# Patient Record
Sex: Female | Born: 1935 | Race: White | Hispanic: No | Marital: Married
Health system: Southern US, Community
[De-identification: ages and names within clinical notes are randomized; demographics above are authoritative.]

---

## 2005-10-20 ENCOUNTER — Ambulatory Visit: Payer: Self-pay | Admitting: Internal Medicine

## 2005-10-20 ENCOUNTER — Ambulatory Visit (HOSPITAL_COMMUNITY): Admission: RE | Admit: 2005-10-20 | Discharge: 2005-10-20 | Payer: Self-pay | Admitting: Internal Medicine

## 2011-02-08 ENCOUNTER — Ambulatory Visit (INDEPENDENT_AMBULATORY_CARE_PROVIDER_SITE_OTHER): Payer: Medicare Other | Admitting: Internal Medicine

## 2011-02-08 DIAGNOSIS — R198 Other specified symptoms and signs involving the digestive system and abdomen: Secondary | ICD-10-CM

## 2011-02-08 DIAGNOSIS — K589 Irritable bowel syndrome without diarrhea: Secondary | ICD-10-CM

## 2011-02-08 DIAGNOSIS — R197 Diarrhea, unspecified: Secondary | ICD-10-CM

## 2011-02-26 NOTE — Consult Note (Signed)
NAME:  Carol Moore, Carol Moore               ACCOUNT NO.:  [id number]  MEDICAL RECORD NO.:                     PATIENT TYPE:  LOCATION:                                 FACILITY:  PHYSICIAN:  Lionel December, M.D.    DATE OF BIRTH:  Mar 07, 1930  DATE OF CONSULTATION: DATE OF DISCHARGE:                                CONSULTATION   Please note that this note is for [facility] GI diseases.  PRESENTING COMPLAINT:  Irregular bowel movements since December 2011.  HISTORY OF PRESENT ILLNESS:  Carol Moore is a 77 year old Caucasian female who is being evaluated at request by her son, Dr. Carylon Perches, for irregular bowel movements.  Carol Moore states that her present problem started soon after Christmas holidays while she was visiting her family, [place], [place].  She had abdominal cramps and formed stool by loose stools and diarrhea lasted for about 24 hours.  She does not recall that she had fever or rectal bleeding.  She did experience some nausea, relieved with her bowel movements.  Since then she has changed her diet.  However, her bowels have not been regular.  She has kept stool diary for the last few weeks and she either has hard large bowel movements, loose stools, or she may skip 2-3 days at a time without a bowel movement.  At times, she does not feel right in her abdomen what she describes as unsettled abdomen.  She has not had low-grade cramps. She has tried [place] for 2 weeks, but it did not make any difference. She says her appetite is not been as good.  She did lose couple of pounds, but she has gained it back.  She denies passing excessive flatus.  She is also had sporadic episodes of fecal seepage and she has had one recently.  She states when she has an urge, she better run to the bathroom.  She is also very concerned because of unpredictability and she states that she is afraid to go watch a play or movie.  Back in February, she took metronidazole for 1 week without  any relief of her symptoms.  She was given WelChol by her primary care physician in [place] and [place], but it did not help either.  She has not experienced any heartburn, dysuria, hematuria, or vaginal symptoms.  She is up-to- date on her pelvic exam, exam which have been normal.  The patient's last screening colonoscopy was about 5 years ago here at [facility], and it was possibly normal.  I do not have the actual procedure note with me.  CURRENT MEDICATIONS: 1. HyoMax sublingual one at bedtime. 2. Latanoprost eyedrops 1 drop to each eye once a day. 3. Restasis 1 drop to each eye twice daily.  PAST MEDICAL HISTORY:  She has glaucoma involving both eyelids.  She had tonsillectomy at age 68.  She had screening colonoscopy about 5 years ago is unremarkable.  ALLERGIES:  NK.  FAMILY HISTORY:  Mother lived to be in her 70s.  Father died at 64 following prostate surgery.  She has two sisters living;  one sister died in her 18s, one sister is 22 in a retirement home, another sister is 20 years old who has been treated for breast CA and is doing very well. She lives at home.  She does have macular degeneration.  SOCIAL HISTORY:  She lost her husband 1 year ago.  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 She lives in [place], [place].  She is visiting her family in [place].  She has one son, Dr. Carylon Perches.  She has never smoked cigarettes or drank alcohol.  OBJECTIVE:  VITAL SIGNS:  Weight 125.5 pounds, she is 65 inches tall, pulse 68 per minute and regular, blood pressure 170/90. HEENT:  Conjunctivae is pink.  Sclerae nonicteric.  Oropharyngeal mucosa is normal.  No neck masses or thyromegaly noted.  CARDIAC:  Regular rhythm.  Normal S1 and S3.  No murmur or gallop noted. LUNGS:  Clear to auscultation. ABDOMEN:  Flat.  Bowel sounds are normal.  On palpation soft abdomen without hepatosplenomegaly or masses. EXTREMITIES:  No  peripheral edema or clubbing noted.  ASSESSMENT:  Carol Moore symptoms are typical of irritable bowel syndrome. She does not have any alarm symptoms.  However, she has not responded to therapy to date.  If she does not respond to simple measures, we would consider 2-week course of Xifaxan before pursuing with sigmoidoscopy, etc.  RECOMMENDATIONS: 1. Hemoccult x1. 2. She will have CBC with diff, TSH, and serum calcium. 3. She will continue her current diet.  She will start Benefiber 3-4 g     daily. 4. Colace 2 tablets at bedtime.  She will keep a stool diary.  If she does not have a BM in 2 days, but feels an urge, if cannot have a BM, she will use glycerine or bisacodyl suppository.  Progress will be reviewed over the phone with her in 4 weeks.  We appreciate the opportunity to participate   Dictation ended at this point.     Lionel December, M.D.     NR/MEDQ  D:  02/05/2011  T:  02/06/2011  Job:  [id number]  Electronically Signed by Lionel December M.D. on 02/26/2011 11:29:33 AM

## 2014-01-04 DIAGNOSIS — H26499 Other secondary cataract, unspecified eye: Secondary | ICD-10-CM | POA: Diagnosis not present

## 2014-01-04 DIAGNOSIS — H4011X0 Primary open-angle glaucoma, stage unspecified: Secondary | ICD-10-CM | POA: Diagnosis not present

## 2014-01-04 DIAGNOSIS — H00029 Hordeolum internum unspecified eye, unspecified eyelid: Secondary | ICD-10-CM | POA: Diagnosis not present

## 2014-01-04 DIAGNOSIS — H04129 Dry eye syndrome of unspecified lacrimal gland: Secondary | ICD-10-CM | POA: Diagnosis not present

## 2014-05-07 DIAGNOSIS — I10 Essential (primary) hypertension: Secondary | ICD-10-CM | POA: Diagnosis not present

## 2014-05-07 DIAGNOSIS — Z79899 Other long term (current) drug therapy: Secondary | ICD-10-CM | POA: Diagnosis not present

## 2014-06-04 DIAGNOSIS — H4011X0 Primary open-angle glaucoma, stage unspecified: Secondary | ICD-10-CM | POA: Diagnosis not present

## 2014-07-22 DIAGNOSIS — H26499 Other secondary cataract, unspecified eye: Secondary | ICD-10-CM | POA: Diagnosis not present

## 2014-07-22 DIAGNOSIS — H00029 Hordeolum internum unspecified eye, unspecified eyelid: Secondary | ICD-10-CM | POA: Diagnosis not present

## 2014-07-22 DIAGNOSIS — H04129 Dry eye syndrome of unspecified lacrimal gland: Secondary | ICD-10-CM | POA: Diagnosis not present

## 2014-07-22 DIAGNOSIS — H4011X0 Primary open-angle glaucoma, stage unspecified: Secondary | ICD-10-CM | POA: Diagnosis not present

## 2014-09-13 DIAGNOSIS — Z1231 Encounter for screening mammogram for malignant neoplasm of breast: Secondary | ICD-10-CM | POA: Diagnosis not present

## 2014-09-20 DIAGNOSIS — Z23 Encounter for immunization: Secondary | ICD-10-CM | POA: Diagnosis not present

## 2015-02-06 DIAGNOSIS — H04123 Dry eye syndrome of bilateral lacrimal glands: Secondary | ICD-10-CM | POA: Diagnosis not present

## 2015-02-06 DIAGNOSIS — H26493 Other secondary cataract, bilateral: Secondary | ICD-10-CM | POA: Diagnosis not present

## 2015-02-06 DIAGNOSIS — H4011X2 Primary open-angle glaucoma, moderate stage: Secondary | ICD-10-CM | POA: Diagnosis not present

## 2015-02-06 DIAGNOSIS — H01001 Unspecified blepharitis right upper eyelid: Secondary | ICD-10-CM | POA: Diagnosis not present

## 2015-02-13 DIAGNOSIS — H4011X2 Primary open-angle glaucoma, moderate stage: Secondary | ICD-10-CM | POA: Diagnosis not present

## 2015-04-08 ENCOUNTER — Telehealth: Payer: Self-pay | Admitting: *Deleted

## 2015-04-08 NOTE — Telephone Encounter (Addendum)
Entered in error

## 2015-08-11 DIAGNOSIS — H01001 Unspecified blepharitis right upper eyelid: Secondary | ICD-10-CM | POA: Diagnosis not present

## 2015-08-11 DIAGNOSIS — H401132 Primary open-angle glaucoma, bilateral, moderate stage: Secondary | ICD-10-CM | POA: Diagnosis not present

## 2015-08-11 DIAGNOSIS — H04123 Dry eye syndrome of bilateral lacrimal glands: Secondary | ICD-10-CM | POA: Diagnosis not present

## 2015-08-11 DIAGNOSIS — H26493 Other secondary cataract, bilateral: Secondary | ICD-10-CM | POA: Diagnosis not present

## 2015-09-08 DIAGNOSIS — H401132 Primary open-angle glaucoma, bilateral, moderate stage: Secondary | ICD-10-CM | POA: Diagnosis not present

## 2015-09-15 DIAGNOSIS — Z1231 Encounter for screening mammogram for malignant neoplasm of breast: Secondary | ICD-10-CM | POA: Diagnosis not present

## 2015-09-19 DIAGNOSIS — Z79899 Other long term (current) drug therapy: Secondary | ICD-10-CM | POA: Diagnosis not present

## 2015-09-19 DIAGNOSIS — Z23 Encounter for immunization: Secondary | ICD-10-CM | POA: Diagnosis not present

## 2015-09-19 DIAGNOSIS — E785 Hyperlipidemia, unspecified: Secondary | ICD-10-CM | POA: Diagnosis not present

## 2015-09-19 DIAGNOSIS — I10 Essential (primary) hypertension: Secondary | ICD-10-CM | POA: Diagnosis not present

## 2016-02-06 DIAGNOSIS — H401132 Primary open-angle glaucoma, bilateral, moderate stage: Secondary | ICD-10-CM | POA: Diagnosis not present

## 2016-02-06 DIAGNOSIS — H04123 Dry eye syndrome of bilateral lacrimal glands: Secondary | ICD-10-CM | POA: Diagnosis not present

## 2016-02-06 DIAGNOSIS — H26493 Other secondary cataract, bilateral: Secondary | ICD-10-CM | POA: Diagnosis not present

## 2016-02-06 DIAGNOSIS — H43813 Vitreous degeneration, bilateral: Secondary | ICD-10-CM | POA: Diagnosis not present

## 2016-05-18 DIAGNOSIS — M25531 Pain in right wrist: Secondary | ICD-10-CM | POA: Diagnosis not present

## 2016-06-28 DIAGNOSIS — L821 Other seborrheic keratosis: Secondary | ICD-10-CM | POA: Diagnosis not present

## 2016-06-28 DIAGNOSIS — L570 Actinic keratosis: Secondary | ICD-10-CM | POA: Diagnosis not present

## 2016-06-28 DIAGNOSIS — X32XXXD Exposure to sunlight, subsequent encounter: Secondary | ICD-10-CM | POA: Diagnosis not present

## 2016-06-28 DIAGNOSIS — Z1283 Encounter for screening for malignant neoplasm of skin: Secondary | ICD-10-CM | POA: Diagnosis not present

## 2016-06-28 DIAGNOSIS — D225 Melanocytic nevi of trunk: Secondary | ICD-10-CM | POA: Diagnosis not present

## 2016-07-08 DIAGNOSIS — H401132 Primary open-angle glaucoma, bilateral, moderate stage: Secondary | ICD-10-CM | POA: Diagnosis not present

## 2016-08-16 DIAGNOSIS — H401132 Primary open-angle glaucoma, bilateral, moderate stage: Secondary | ICD-10-CM | POA: Diagnosis not present

## 2016-08-16 DIAGNOSIS — H26493 Other secondary cataract, bilateral: Secondary | ICD-10-CM | POA: Diagnosis not present

## 2016-08-16 DIAGNOSIS — H04123 Dry eye syndrome of bilateral lacrimal glands: Secondary | ICD-10-CM | POA: Diagnosis not present

## 2016-09-15 DIAGNOSIS — Z23 Encounter for immunization: Secondary | ICD-10-CM | POA: Diagnosis not present

## 2016-10-07 DIAGNOSIS — I10 Essential (primary) hypertension: Secondary | ICD-10-CM | POA: Diagnosis not present

## 2016-10-07 DIAGNOSIS — S79912A Unspecified injury of left hip, initial encounter: Secondary | ICD-10-CM | POA: Diagnosis not present

## 2016-10-07 DIAGNOSIS — S99912A Unspecified injury of left ankle, initial encounter: Secondary | ICD-10-CM | POA: Diagnosis not present

## 2016-10-07 DIAGNOSIS — S79911A Unspecified injury of right hip, initial encounter: Secondary | ICD-10-CM | POA: Diagnosis not present

## 2016-10-07 DIAGNOSIS — S32501A Unspecified fracture of right pubis, initial encounter for closed fracture: Secondary | ICD-10-CM | POA: Diagnosis not present

## 2016-10-07 DIAGNOSIS — W1839XA Other fall on same level, initial encounter: Secondary | ICD-10-CM | POA: Diagnosis not present

## 2016-10-07 DIAGNOSIS — Z9089 Acquired absence of other organs: Secondary | ICD-10-CM | POA: Diagnosis not present

## 2016-10-07 DIAGNOSIS — M25551 Pain in right hip: Secondary | ICD-10-CM | POA: Diagnosis not present

## 2016-10-07 DIAGNOSIS — Y9389 Activity, other specified: Secondary | ICD-10-CM | POA: Diagnosis not present

## 2016-10-28 ENCOUNTER — Other Ambulatory Visit (HOSPITAL_COMMUNITY): Payer: Self-pay | Admitting: Internal Medicine

## 2016-10-28 ENCOUNTER — Ambulatory Visit (HOSPITAL_COMMUNITY)
Admission: RE | Admit: 2016-10-28 | Discharge: 2016-10-28 | Disposition: A | Discharge status: Home / Self Care | Payer: Medicare Other | Source: Ambulatory Visit | Attending: Internal Medicine | Admitting: Internal Medicine

## 2016-10-28 DIAGNOSIS — M25552 Pain in left hip: Secondary | ICD-10-CM | POA: Diagnosis not present

## 2016-10-28 DIAGNOSIS — S32502A Unspecified fracture of left pubis, initial encounter for closed fracture: Secondary | ICD-10-CM | POA: Diagnosis not present

## 2016-10-28 DIAGNOSIS — S79912A Unspecified injury of left hip, initial encounter: Secondary | ICD-10-CM | POA: Diagnosis not present

## 2016-12-17 DIAGNOSIS — Z1231 Encounter for screening mammogram for malignant neoplasm of breast: Secondary | ICD-10-CM | POA: Diagnosis not present

## 2017-01-13 DIAGNOSIS — H401132 Primary open-angle glaucoma, bilateral, moderate stage: Secondary | ICD-10-CM | POA: Diagnosis not present

## 2017-02-01 ENCOUNTER — Other Ambulatory Visit (HOSPITAL_COMMUNITY): Payer: Self-pay | Admitting: Internal Medicine

## 2017-02-01 DIAGNOSIS — M80059A Age-related osteoporosis with current pathological fracture, unspecified femur, initial encounter for fracture: Secondary | ICD-10-CM

## 2017-02-03 ENCOUNTER — Ambulatory Visit (HOSPITAL_COMMUNITY)
Admission: RE | Admit: 2017-02-03 | Discharge: 2017-02-03 | Disposition: A | Discharge status: Home / Self Care | Payer: Medicare Other | Source: Ambulatory Visit | Attending: Internal Medicine | Admitting: Internal Medicine

## 2017-02-03 ENCOUNTER — Other Ambulatory Visit (HOSPITAL_COMMUNITY): Payer: Medicare Other

## 2017-02-03 DIAGNOSIS — Z780 Asymptomatic menopausal state: Secondary | ICD-10-CM | POA: Insufficient documentation

## 2017-02-03 DIAGNOSIS — M810 Age-related osteoporosis without current pathological fracture: Secondary | ICD-10-CM | POA: Insufficient documentation

## 2017-02-03 DIAGNOSIS — M80059A Age-related osteoporosis with current pathological fracture, unspecified femur, initial encounter for fracture: Secondary | ICD-10-CM

## 2017-03-17 DIAGNOSIS — H04123 Dry eye syndrome of bilateral lacrimal glands: Secondary | ICD-10-CM | POA: Diagnosis not present

## 2017-03-17 DIAGNOSIS — H26493 Other secondary cataract, bilateral: Secondary | ICD-10-CM | POA: Diagnosis not present

## 2017-03-17 DIAGNOSIS — H401132 Primary open-angle glaucoma, bilateral, moderate stage: Secondary | ICD-10-CM | POA: Diagnosis not present

## 2017-03-17 DIAGNOSIS — H01001 Unspecified blepharitis right upper eyelid: Secondary | ICD-10-CM | POA: Diagnosis not present

## 2017-08-04 DIAGNOSIS — H401132 Primary open-angle glaucoma, bilateral, moderate stage: Secondary | ICD-10-CM | POA: Diagnosis not present

## 2017-08-19 DIAGNOSIS — Z23 Encounter for immunization: Secondary | ICD-10-CM | POA: Diagnosis not present

## 2017-09-27 DIAGNOSIS — H04123 Dry eye syndrome of bilateral lacrimal glands: Secondary | ICD-10-CM | POA: Diagnosis not present

## 2017-09-27 DIAGNOSIS — H26493 Other secondary cataract, bilateral: Secondary | ICD-10-CM | POA: Diagnosis not present

## 2017-09-27 DIAGNOSIS — H43813 Vitreous degeneration, bilateral: Secondary | ICD-10-CM | POA: Diagnosis not present

## 2017-09-27 DIAGNOSIS — H401133 Primary open-angle glaucoma, bilateral, severe stage: Secondary | ICD-10-CM | POA: Diagnosis not present

## 2017-10-24 DIAGNOSIS — Z23 Encounter for immunization: Secondary | ICD-10-CM | POA: Diagnosis not present

## 2017-12-19 DIAGNOSIS — Z1231 Encounter for screening mammogram for malignant neoplasm of breast: Secondary | ICD-10-CM | POA: Diagnosis not present

## 2018-02-27 DIAGNOSIS — H401133 Primary open-angle glaucoma, bilateral, severe stage: Secondary | ICD-10-CM | POA: Diagnosis not present

## 2018-03-23 DIAGNOSIS — H401133 Primary open-angle glaucoma, bilateral, severe stage: Secondary | ICD-10-CM | POA: Diagnosis not present

## 2018-03-23 DIAGNOSIS — H04123 Dry eye syndrome of bilateral lacrimal glands: Secondary | ICD-10-CM | POA: Diagnosis not present

## 2018-05-05 DIAGNOSIS — I10 Essential (primary) hypertension: Secondary | ICD-10-CM | POA: Diagnosis not present

## 2018-05-05 DIAGNOSIS — Z79899 Other long term (current) drug therapy: Secondary | ICD-10-CM | POA: Diagnosis not present

## 2018-05-05 DIAGNOSIS — E785 Hyperlipidemia, unspecified: Secondary | ICD-10-CM | POA: Diagnosis not present

## 2018-09-05 DIAGNOSIS — H43813 Vitreous degeneration, bilateral: Secondary | ICD-10-CM | POA: Diagnosis not present

## 2018-09-05 DIAGNOSIS — H26493 Other secondary cataract, bilateral: Secondary | ICD-10-CM | POA: Diagnosis not present

## 2018-09-05 DIAGNOSIS — H401133 Primary open-angle glaucoma, bilateral, severe stage: Secondary | ICD-10-CM | POA: Diagnosis not present

## 2018-09-05 DIAGNOSIS — D3132 Benign neoplasm of left choroid: Secondary | ICD-10-CM | POA: Diagnosis not present

## 2018-09-18 ENCOUNTER — Other Ambulatory Visit (HOSPITAL_COMMUNITY): Payer: Self-pay | Admitting: Internal Medicine

## 2018-09-18 DIAGNOSIS — R05 Cough: Secondary | ICD-10-CM

## 2018-09-18 DIAGNOSIS — R059 Cough, unspecified: Secondary | ICD-10-CM

## 2018-09-20 ENCOUNTER — Ambulatory Visit (HOSPITAL_COMMUNITY)
Admission: RE | Admit: 2018-09-20 | Discharge: 2018-09-20 | Disposition: A | Discharge status: Home / Self Care | Payer: Medicare Other | Source: Ambulatory Visit | Attending: Internal Medicine | Admitting: Internal Medicine

## 2018-09-20 DIAGNOSIS — J984 Other disorders of lung: Secondary | ICD-10-CM | POA: Diagnosis not present

## 2018-09-20 DIAGNOSIS — R059 Cough, unspecified: Secondary | ICD-10-CM

## 2018-09-20 DIAGNOSIS — J90 Pleural effusion, not elsewhere classified: Secondary | ICD-10-CM | POA: Insufficient documentation

## 2018-09-20 DIAGNOSIS — R918 Other nonspecific abnormal finding of lung field: Secondary | ICD-10-CM | POA: Insufficient documentation

## 2018-09-20 DIAGNOSIS — R05 Cough: Secondary | ICD-10-CM | POA: Insufficient documentation

## 2019-07-02 ENCOUNTER — Other Ambulatory Visit: Payer: Self-pay

## 2019-07-02 ENCOUNTER — Ambulatory Visit (HOSPITAL_COMMUNITY)
Admission: RE | Admit: 2019-07-02 | Discharge: 2019-07-02 | Disposition: A | Discharge status: Home / Self Care | Payer: Medicare Other | Source: Ambulatory Visit | Attending: Internal Medicine | Admitting: Internal Medicine

## 2019-07-02 ENCOUNTER — Other Ambulatory Visit (HOSPITAL_COMMUNITY): Payer: Self-pay | Admitting: Internal Medicine

## 2019-07-02 DIAGNOSIS — R05 Cough: Secondary | ICD-10-CM

## 2019-07-02 DIAGNOSIS — R059 Cough, unspecified: Secondary | ICD-10-CM

## 2019-10-31 ENCOUNTER — Ambulatory Visit (INDEPENDENT_AMBULATORY_CARE_PROVIDER_SITE_OTHER): Payer: Medicare Other | Admitting: Podiatry

## 2019-10-31 ENCOUNTER — Other Ambulatory Visit: Payer: Self-pay

## 2019-10-31 DIAGNOSIS — L600 Ingrowing nail: Secondary | ICD-10-CM | POA: Diagnosis not present

## 2019-10-31 MED ORDER — GENTAMICIN SULFATE 0.1 % EX CREA: 1.0000 "application " | TOPICAL_CREAM | Freq: Two times a day (BID) | CUTANEOUS | 1 refills | Status: AC

## 2019-10-31 MED ORDER — DOXYCYCLINE HYCLATE 100 MG PO TABS: 100.0000 mg | ORAL_TABLET | Freq: Two times a day (BID) | ORAL | 0 refills | Status: AC

## 2019-11-07 NOTE — Progress Notes (Signed)
   HPI: 86 y.o. female presenting today as a new patient with a chief complaint of pain to the left great toenail that began about two months ago. She reports associated redness and swelling of the area. She denies any modifying factors and has not had any treatment. Patient is here for further evaluation and treatment.   No past medical history on file.   Physical Exam: General: The patient is alert and oriented x3 in no acute distress.  Dermatology: Erythema and edema noted around the nail base of the left great toe. Skin is warm, dry and supple bilateral lower extremities. Negative for open lesions or macerations.  Vascular: Palpable pedal pulses bilaterally. Capillary refill within normal limits.  Neurological: Epicritic and protective threshold grossly intact bilaterally.   Musculoskeletal Exam: Range of motion within normal limits to all pedal and ankle joints bilateral. Muscle strength 5/5 in all groups bilateral.   Assessment: 1. Toenail infection / cellulitis left great toe    Plan of Care:  1. Patient evaluated.  2. Discussed treatment alternatives and plan of care. Explained nail avulsion procedure and post procedure course to patient. 3. Patient opted for total temporary nail avulsion of the left great toenail.  4. Prior to procedure, local anesthesia infiltration utilized using 3 ml of a 50:50 mixture of 2% plain lidocaine and 0.5% plain marcaine in a normal hallux block fashion and a betadine prep performed.  5. Light dressing applied. 6. Prescription for Doxycycline 100 mg #14 provided to patient.  7. Prescription for Gentamicin cream provided to patient to use daily with a bandage.  8. Return to clinic as needed.  Son is Dr. Asencion Noble, family practice in [place].      Edrick Kins, DPM [facility]  Dr. Edrick Kins, DPM    [address], [place] [address]                Office [phone]   Fax [phone]

## 2020-01-24 DIAGNOSIS — H401133 Primary open-angle glaucoma, bilateral, severe stage: Secondary | ICD-10-CM | POA: Diagnosis not present

## 2020-02-19 DIAGNOSIS — H401133 Primary open-angle glaucoma, bilateral, severe stage: Secondary | ICD-10-CM | POA: Diagnosis not present

## 2020-02-19 DIAGNOSIS — H43813 Vitreous degeneration, bilateral: Secondary | ICD-10-CM | POA: Diagnosis not present

## 2020-02-19 DIAGNOSIS — H26493 Other secondary cataract, bilateral: Secondary | ICD-10-CM | POA: Diagnosis not present

## 2020-02-19 DIAGNOSIS — H04123 Dry eye syndrome of bilateral lacrimal glands: Secondary | ICD-10-CM | POA: Diagnosis not present

## 2020-03-06 DIAGNOSIS — Z1231 Encounter for screening mammogram for malignant neoplasm of breast: Secondary | ICD-10-CM | POA: Diagnosis not present

## 2020-08-06 DIAGNOSIS — H26493 Other secondary cataract, bilateral: Secondary | ICD-10-CM | POA: Diagnosis not present

## 2020-09-04 DIAGNOSIS — H26493 Other secondary cataract, bilateral: Secondary | ICD-10-CM | POA: Diagnosis not present

## 2020-09-04 DIAGNOSIS — H401133 Primary open-angle glaucoma, bilateral, severe stage: Secondary | ICD-10-CM | POA: Diagnosis not present

## 2020-09-04 DIAGNOSIS — D3132 Benign neoplasm of left choroid: Secondary | ICD-10-CM | POA: Diagnosis not present

## 2020-09-04 DIAGNOSIS — H43813 Vitreous degeneration, bilateral: Secondary | ICD-10-CM | POA: Diagnosis not present

## 2021-03-10 DIAGNOSIS — D3132 Benign neoplasm of left choroid: Secondary | ICD-10-CM | POA: Diagnosis not present

## 2021-03-10 DIAGNOSIS — H401133 Primary open-angle glaucoma, bilateral, severe stage: Secondary | ICD-10-CM | POA: Diagnosis not present

## 2021-03-10 DIAGNOSIS — H26493 Other secondary cataract, bilateral: Secondary | ICD-10-CM | POA: Diagnosis not present

## 2021-03-10 DIAGNOSIS — H353131 Nonexudative age-related macular degeneration, bilateral, early dry stage: Secondary | ICD-10-CM | POA: Diagnosis not present

## 2021-03-10 DIAGNOSIS — H04123 Dry eye syndrome of bilateral lacrimal glands: Secondary | ICD-10-CM | POA: Diagnosis not present

## 2021-03-11 DIAGNOSIS — Z1231 Encounter for screening mammogram for malignant neoplasm of breast: Secondary | ICD-10-CM | POA: Diagnosis not present

## 2021-05-06 DIAGNOSIS — I10 Essential (primary) hypertension: Secondary | ICD-10-CM | POA: Diagnosis not present

## 2021-05-06 DIAGNOSIS — Z79899 Other long term (current) drug therapy: Secondary | ICD-10-CM | POA: Diagnosis not present

## 2021-05-06 DIAGNOSIS — N3001 Acute cystitis with hematuria: Secondary | ICD-10-CM | POA: Diagnosis not present

## 2021-05-06 DIAGNOSIS — N390 Urinary tract infection, site not specified: Secondary | ICD-10-CM | POA: Diagnosis not present

## 2021-08-24 DIAGNOSIS — H353131 Nonexudative age-related macular degeneration, bilateral, early dry stage: Secondary | ICD-10-CM | POA: Diagnosis not present

## 2021-09-02 DIAGNOSIS — H353131 Nonexudative age-related macular degeneration, bilateral, early dry stage: Secondary | ICD-10-CM | POA: Diagnosis not present

## 2021-09-23 ENCOUNTER — Other Ambulatory Visit: Payer: Self-pay

## 2021-09-23 ENCOUNTER — Other Ambulatory Visit (HOSPITAL_COMMUNITY): Payer: Self-pay | Admitting: Internal Medicine

## 2021-09-23 ENCOUNTER — Ambulatory Visit (HOSPITAL_COMMUNITY)
Admission: RE | Admit: 2021-09-23 | Discharge: 2021-09-23 | Disposition: A | Discharge status: Home / Self Care | Payer: Medicare PPO | Source: Ambulatory Visit | Attending: Internal Medicine | Admitting: Internal Medicine

## 2021-09-23 ENCOUNTER — Other Ambulatory Visit: Payer: Self-pay | Admitting: Internal Medicine

## 2021-09-23 DIAGNOSIS — G311 Senile degeneration of brain, not elsewhere classified: Secondary | ICD-10-CM | POA: Diagnosis not present

## 2021-09-23 DIAGNOSIS — M810 Age-related osteoporosis without current pathological fracture: Secondary | ICD-10-CM | POA: Diagnosis not present

## 2021-09-23 DIAGNOSIS — R410 Disorientation, unspecified: Secondary | ICD-10-CM

## 2021-09-23 DIAGNOSIS — S62633A Displaced fracture of distal phalanx of left middle finger, initial encounter for closed fracture: Secondary | ICD-10-CM | POA: Insufficient documentation

## 2021-09-23 DIAGNOSIS — M19042 Primary osteoarthritis, left hand: Secondary | ICD-10-CM | POA: Insufficient documentation

## 2021-09-23 DIAGNOSIS — G319 Degenerative disease of nervous system, unspecified: Secondary | ICD-10-CM | POA: Diagnosis not present

## 2021-09-23 DIAGNOSIS — R110 Nausea: Secondary | ICD-10-CM

## 2021-09-23 DIAGNOSIS — M79642 Pain in left hand: Secondary | ICD-10-CM | POA: Diagnosis not present

## 2021-09-23 DIAGNOSIS — S02842A Fracture of lateral orbital wall, left side, initial encounter for closed fracture: Secondary | ICD-10-CM | POA: Insufficient documentation

## 2021-09-23 DIAGNOSIS — W19XXXA Unspecified fall, initial encounter: Secondary | ICD-10-CM

## 2021-09-23 DIAGNOSIS — R9082 White matter disease, unspecified: Secondary | ICD-10-CM | POA: Diagnosis not present

## 2021-09-23 DIAGNOSIS — S0083XA Contusion of other part of head, initial encounter: Secondary | ICD-10-CM | POA: Insufficient documentation

## 2021-09-23 DIAGNOSIS — G9389 Other specified disorders of brain: Secondary | ICD-10-CM | POA: Diagnosis not present

## 2021-09-23 DIAGNOSIS — S0282XA Fracture of other specified skull and facial bones, left side, initial encounter for closed fracture: Secondary | ICD-10-CM | POA: Diagnosis not present

## 2021-10-01 DIAGNOSIS — Z23 Encounter for immunization: Secondary | ICD-10-CM | POA: Diagnosis not present

## 2021-10-01 DIAGNOSIS — S01119A Laceration without foreign body of unspecified eyelid and periocular area, initial encounter: Secondary | ICD-10-CM | POA: Diagnosis not present

## 2021-10-03 DIAGNOSIS — S81851A Open bite, right lower leg, initial encounter: Secondary | ICD-10-CM | POA: Diagnosis not present

## 2021-10-03 DIAGNOSIS — Y9289 Other specified places as the place of occurrence of the external cause: Secondary | ICD-10-CM | POA: Diagnosis not present

## 2021-10-03 DIAGNOSIS — Y9389 Activity, other specified: Secondary | ICD-10-CM | POA: Diagnosis not present

## 2021-10-03 DIAGNOSIS — I10 Essential (primary) hypertension: Secondary | ICD-10-CM | POA: Diagnosis not present

## 2021-10-03 DIAGNOSIS — W540XXA Bitten by dog, initial encounter: Secondary | ICD-10-CM | POA: Diagnosis not present

## 2021-10-03 DIAGNOSIS — S81852A Open bite, left lower leg, initial encounter: Secondary | ICD-10-CM | POA: Diagnosis not present

## 2021-10-12 DIAGNOSIS — S81812D Laceration without foreign body, left lower leg, subsequent encounter: Secondary | ICD-10-CM | POA: Diagnosis not present

## 2022-03-12 DIAGNOSIS — H353131 Nonexudative age-related macular degeneration, bilateral, early dry stage: Secondary | ICD-10-CM | POA: Diagnosis not present

## 2022-03-12 DIAGNOSIS — D3132 Benign neoplasm of left choroid: Secondary | ICD-10-CM | POA: Diagnosis not present

## 2022-03-12 DIAGNOSIS — H401133 Primary open-angle glaucoma, bilateral, severe stage: Secondary | ICD-10-CM | POA: Diagnosis not present

## 2022-03-12 DIAGNOSIS — H26493 Other secondary cataract, bilateral: Secondary | ICD-10-CM | POA: Diagnosis not present

## 2022-03-16 DIAGNOSIS — Z1231 Encounter for screening mammogram for malignant neoplasm of breast: Secondary | ICD-10-CM | POA: Diagnosis not present

## 2022-03-30 DIAGNOSIS — M79671 Pain in right foot: Secondary | ICD-10-CM | POA: Diagnosis not present

## 2022-03-30 DIAGNOSIS — M779 Enthesopathy, unspecified: Secondary | ICD-10-CM | POA: Diagnosis not present

## 2022-03-30 DIAGNOSIS — L84 Corns and callosities: Secondary | ICD-10-CM | POA: Diagnosis not present

## 2022-03-30 DIAGNOSIS — I7389 Other specified peripheral vascular diseases: Secondary | ICD-10-CM | POA: Diagnosis not present

## 2022-03-30 DIAGNOSIS — M2041 Other hammer toe(s) (acquired), right foot: Secondary | ICD-10-CM | POA: Diagnosis not present

## 2022-05-26 DIAGNOSIS — H401133 Primary open-angle glaucoma, bilateral, severe stage: Secondary | ICD-10-CM | POA: Diagnosis not present

## 2022-07-21 DIAGNOSIS — M79674 Pain in right toe(s): Secondary | ICD-10-CM | POA: Diagnosis not present

## 2022-07-21 DIAGNOSIS — M79672 Pain in left foot: Secondary | ICD-10-CM | POA: Diagnosis not present

## 2022-07-21 DIAGNOSIS — L851 Acquired keratosis [keratoderma] palmaris et plantaris: Secondary | ICD-10-CM | POA: Diagnosis not present

## 2022-07-21 DIAGNOSIS — M79675 Pain in left toe(s): Secondary | ICD-10-CM | POA: Diagnosis not present

## 2022-07-21 DIAGNOSIS — G609 Hereditary and idiopathic neuropathy, unspecified: Secondary | ICD-10-CM | POA: Diagnosis not present

## 2022-07-21 DIAGNOSIS — M79671 Pain in right foot: Secondary | ICD-10-CM | POA: Diagnosis not present

## 2022-07-21 DIAGNOSIS — B351 Tinea unguium: Secondary | ICD-10-CM | POA: Diagnosis not present

## 2022-09-14 DIAGNOSIS — H401133 Primary open-angle glaucoma, bilateral, severe stage: Secondary | ICD-10-CM | POA: Diagnosis not present

## 2022-09-14 DIAGNOSIS — H26493 Other secondary cataract, bilateral: Secondary | ICD-10-CM | POA: Diagnosis not present

## 2022-09-14 DIAGNOSIS — H04123 Dry eye syndrome of bilateral lacrimal glands: Secondary | ICD-10-CM | POA: Diagnosis not present

## 2022-10-07 DIAGNOSIS — B309 Viral conjunctivitis, unspecified: Secondary | ICD-10-CM | POA: Diagnosis not present

## 2022-11-22 DIAGNOSIS — D1801 Hemangioma of skin and subcutaneous tissue: Secondary | ICD-10-CM | POA: Diagnosis not present

## 2022-11-22 DIAGNOSIS — L570 Actinic keratosis: Secondary | ICD-10-CM | POA: Diagnosis not present

## 2022-11-22 DIAGNOSIS — L821 Other seborrheic keratosis: Secondary | ICD-10-CM | POA: Diagnosis not present

## 2022-11-22 DIAGNOSIS — D225 Melanocytic nevi of trunk: Secondary | ICD-10-CM | POA: Diagnosis not present

## 2022-11-22 DIAGNOSIS — L820 Inflamed seborrheic keratosis: Secondary | ICD-10-CM | POA: Diagnosis not present

## 2022-12-08 DIAGNOSIS — M79674 Pain in right toe(s): Secondary | ICD-10-CM | POA: Diagnosis not present

## 2022-12-08 DIAGNOSIS — M79671 Pain in right foot: Secondary | ICD-10-CM | POA: Diagnosis not present

## 2022-12-08 DIAGNOSIS — M79675 Pain in left toe(s): Secondary | ICD-10-CM | POA: Diagnosis not present

## 2022-12-08 DIAGNOSIS — G609 Hereditary and idiopathic neuropathy, unspecified: Secondary | ICD-10-CM | POA: Diagnosis not present

## 2022-12-08 DIAGNOSIS — L851 Acquired keratosis [keratoderma] palmaris et plantaris: Secondary | ICD-10-CM | POA: Diagnosis not present

## 2022-12-08 DIAGNOSIS — B351 Tinea unguium: Secondary | ICD-10-CM | POA: Diagnosis not present

## 2022-12-08 DIAGNOSIS — M79672 Pain in left foot: Secondary | ICD-10-CM | POA: Diagnosis not present

## 2023-03-15 DIAGNOSIS — H04123 Dry eye syndrome of bilateral lacrimal glands: Secondary | ICD-10-CM | POA: Diagnosis not present

## 2023-03-15 DIAGNOSIS — H26493 Other secondary cataract, bilateral: Secondary | ICD-10-CM | POA: Diagnosis not present

## 2023-03-15 DIAGNOSIS — H401133 Primary open-angle glaucoma, bilateral, severe stage: Secondary | ICD-10-CM | POA: Diagnosis not present

## 2023-03-18 DIAGNOSIS — Z1231 Encounter for screening mammogram for malignant neoplasm of breast: Secondary | ICD-10-CM | POA: Diagnosis not present

## 2023-05-18 DIAGNOSIS — I7389 Other specified peripheral vascular diseases: Secondary | ICD-10-CM | POA: Diagnosis not present

## 2023-05-18 DIAGNOSIS — M79674 Pain in right toe(s): Secondary | ICD-10-CM | POA: Diagnosis not present

## 2023-05-18 DIAGNOSIS — G609 Hereditary and idiopathic neuropathy, unspecified: Secondary | ICD-10-CM | POA: Diagnosis not present

## 2023-05-18 DIAGNOSIS — M79671 Pain in right foot: Secondary | ICD-10-CM | POA: Diagnosis not present

## 2023-05-18 DIAGNOSIS — M79672 Pain in left foot: Secondary | ICD-10-CM | POA: Diagnosis not present

## 2023-05-18 DIAGNOSIS — M79675 Pain in left toe(s): Secondary | ICD-10-CM | POA: Diagnosis not present

## 2023-05-18 DIAGNOSIS — L84 Corns and callosities: Secondary | ICD-10-CM | POA: Diagnosis not present

## 2023-05-18 DIAGNOSIS — B351 Tinea unguium: Secondary | ICD-10-CM | POA: Diagnosis not present

## 2023-05-23 DIAGNOSIS — H401133 Primary open-angle glaucoma, bilateral, severe stage: Secondary | ICD-10-CM | POA: Diagnosis not present

## 2023-05-23 DIAGNOSIS — B309 Viral conjunctivitis, unspecified: Secondary | ICD-10-CM | POA: Diagnosis not present

## 2023-05-23 DIAGNOSIS — H26493 Other secondary cataract, bilateral: Secondary | ICD-10-CM | POA: Diagnosis not present

## 2023-05-23 DIAGNOSIS — H04123 Dry eye syndrome of bilateral lacrimal glands: Secondary | ICD-10-CM | POA: Diagnosis not present

## 2023-05-31 IMAGING — DX DG HAND COMPLETE 3+V*L*
3 series · 3 of 3 positions shown · non-contrast
Comparison: None.

CLINICAL DATA: Fell yesterday.  Left hand and thumb pain.

EXAM:
LEFT HAND - COMPLETE 3+ VIEW

[hand pa]
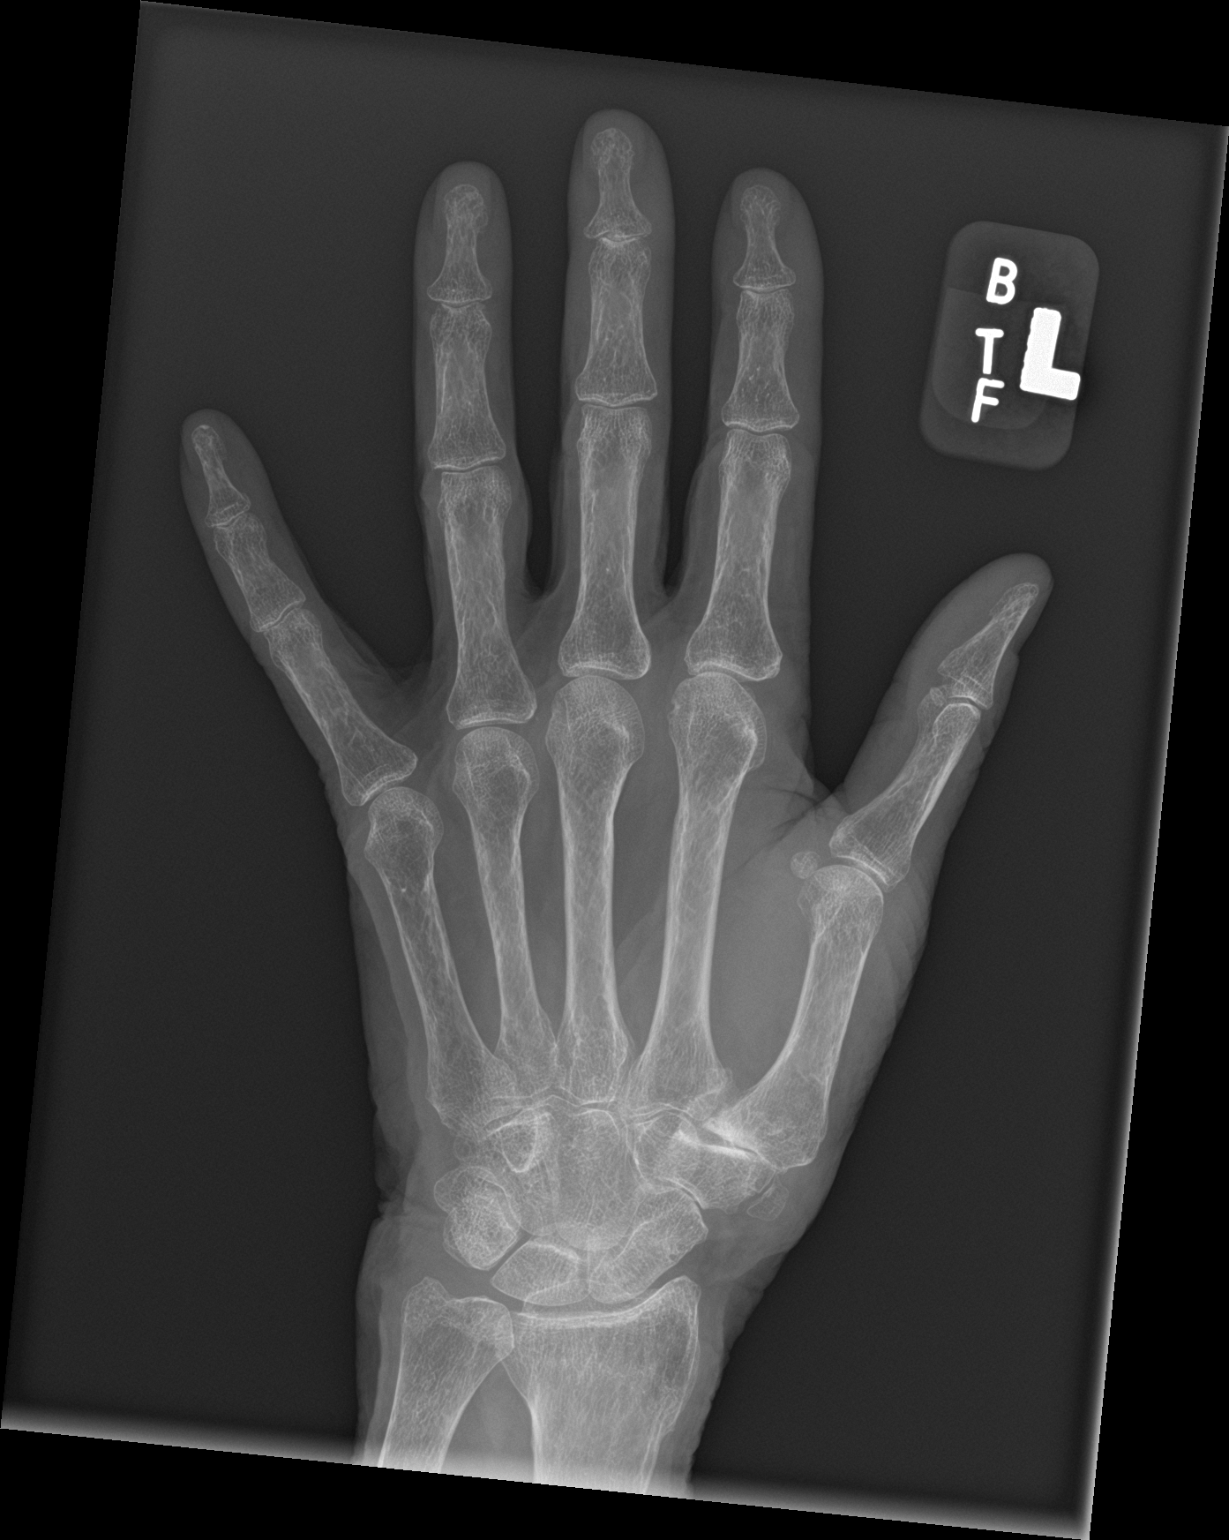

[hand obl]
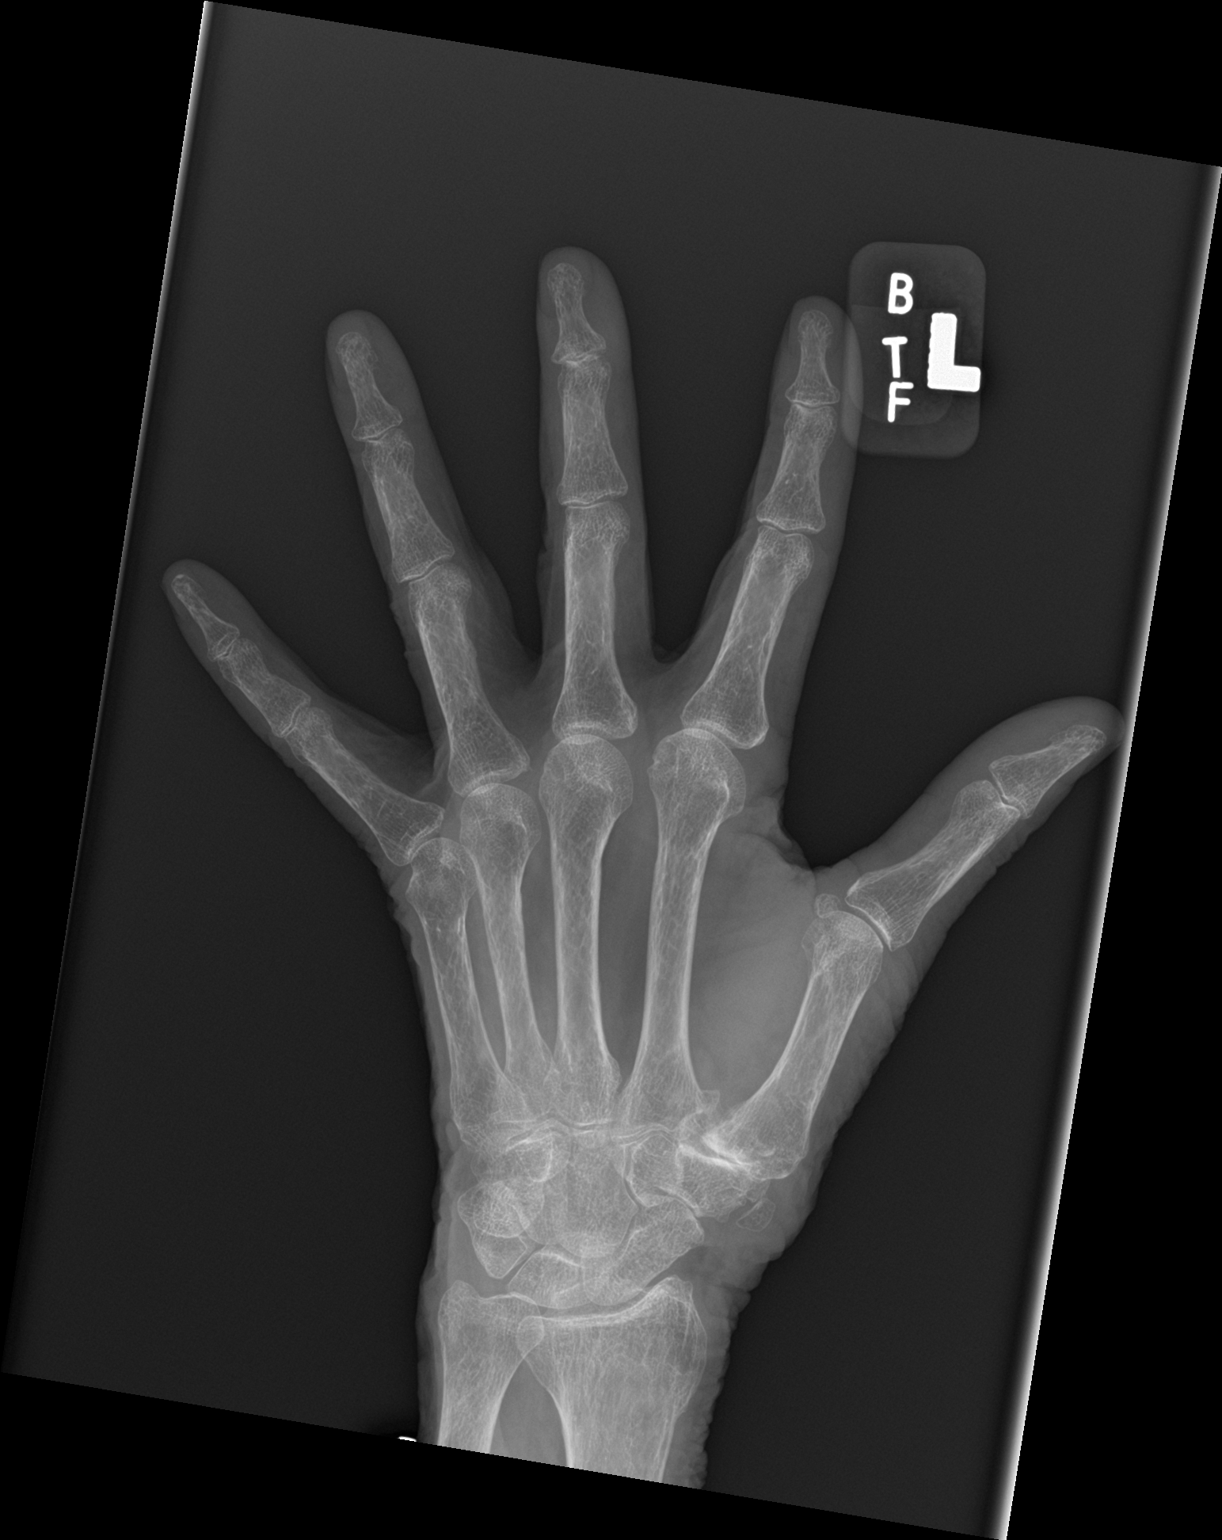

[hand lat]
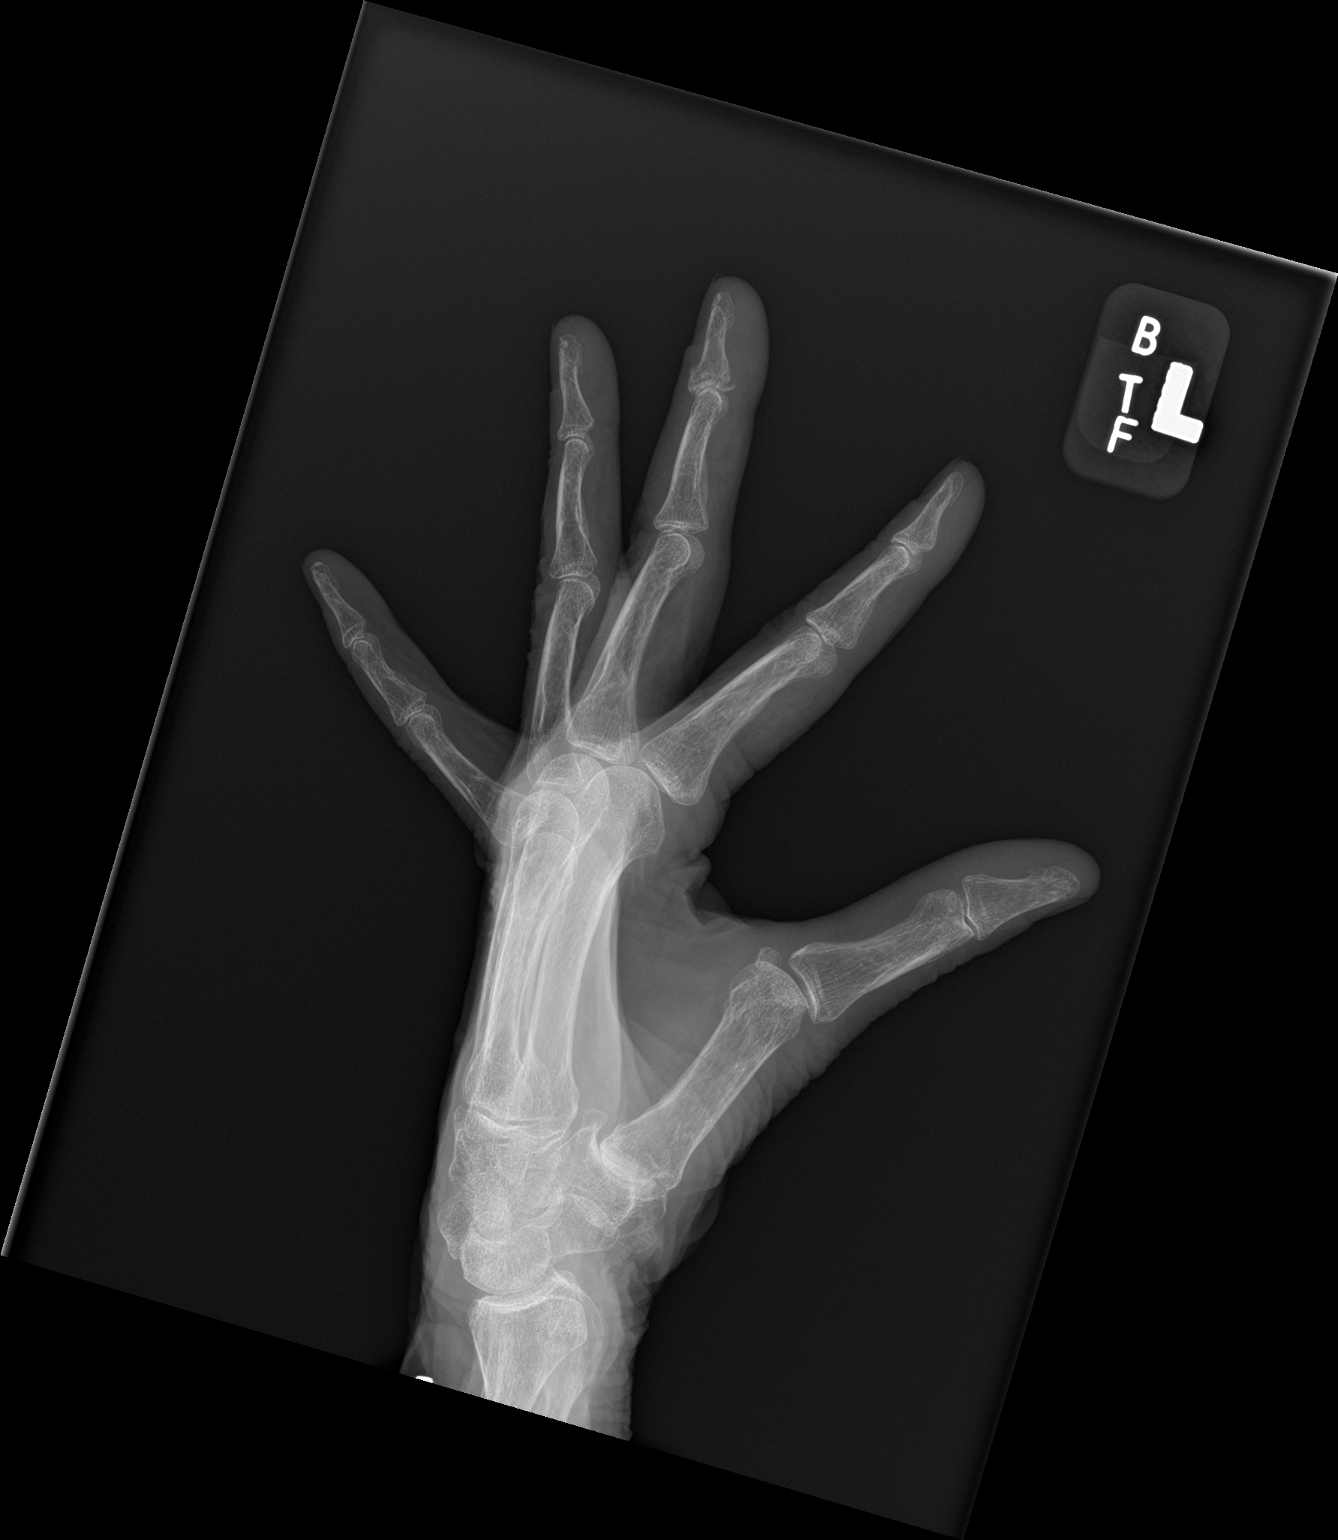

[3 of 3 positions shown; findings below may reference images not displayed]

FINDINGS: Age related diffuse osteoporosis.

Moderate degenerative changes at the CMC joint of thumb. Adjacent
smoothly corticated bony density could be an old avulsion fracture.

Fracture involving the distal phalanx of the middle finger with a
volar plate avulsion type fracture and also a small suspected
intra-articular fracture involving the dorsal plate.
IMPRESSION: 1. Distal phalanx fracture of the long finger. No definite wrist
fractures.
2. Advanced degenerative changes at the CMC joint of the thumb.

## 2023-05-31 IMAGING — CT CT HEAD W/O CM
3 series · 15 of 47 positions shown, 18 images · non-contrast
Comparison: None.

CLINICAL DATA: Fell.  Hit head.  Left forehead abrasion.

EXAM:
CT HEAD WITHOUT CONTRAST
TECHNIQUE: Contiguous axial images were obtained from the base of the skull
through the vertex without intravenous contrast.

[Series 2: head w o · axial · 0.41mm/px · z∈[+4,+134]mm · 9 of 32 slices shown, 12 images]
[im 3/32  brain]
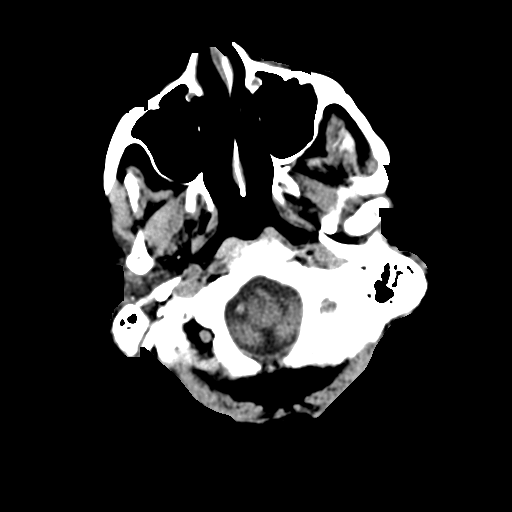
[im 3/32  bone]
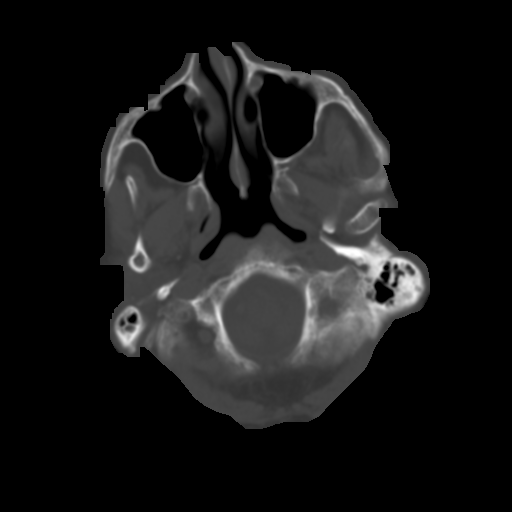
[im 6/32  brain]
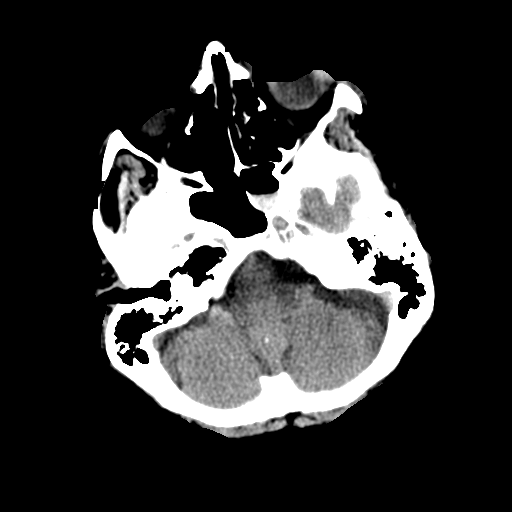
[im 9/32  brain]
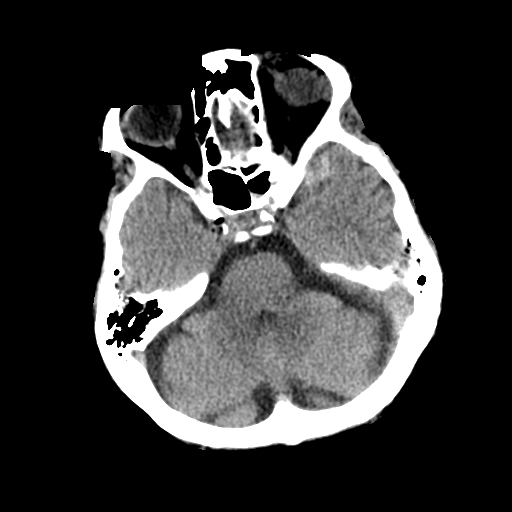
[im 12/32  brain]
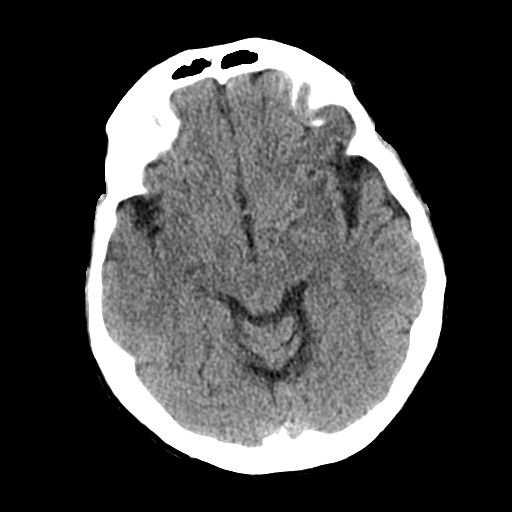
[im 17/32  brain]
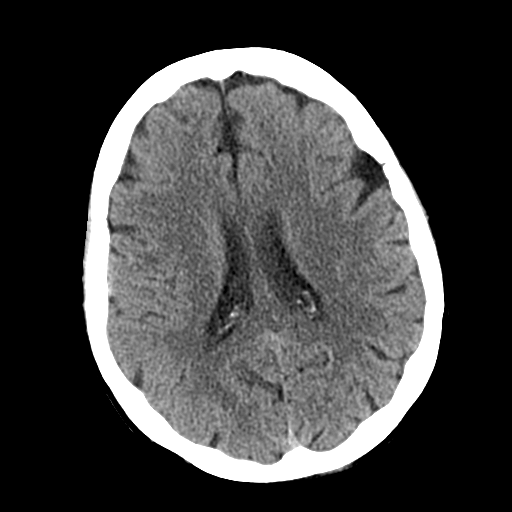
[im 17/32  bone]
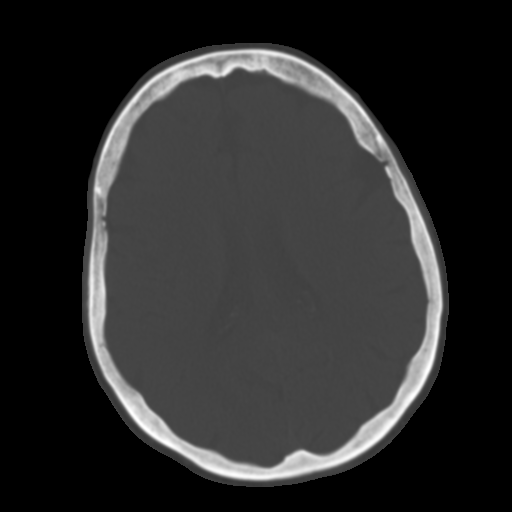
[im 20/32  brain]
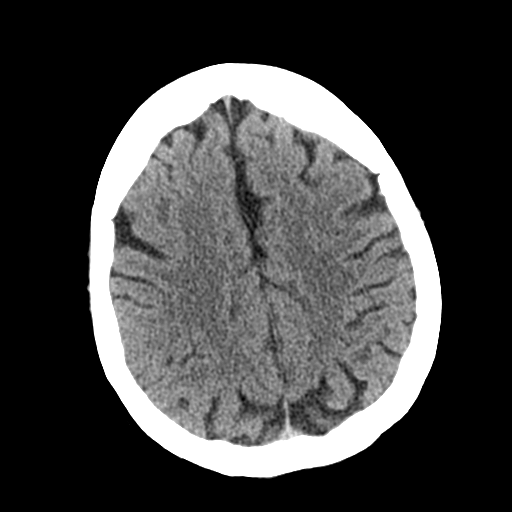
[im 23/32  brain]
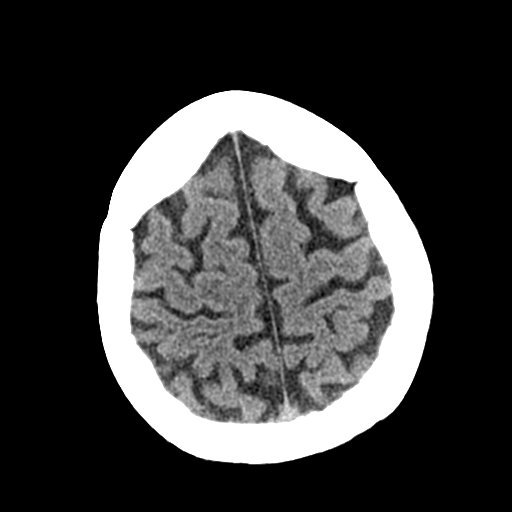
[im 26/32  brain]
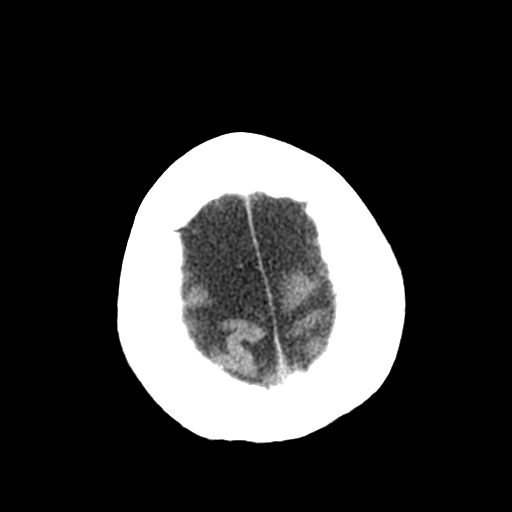
[im 29/32  brain]
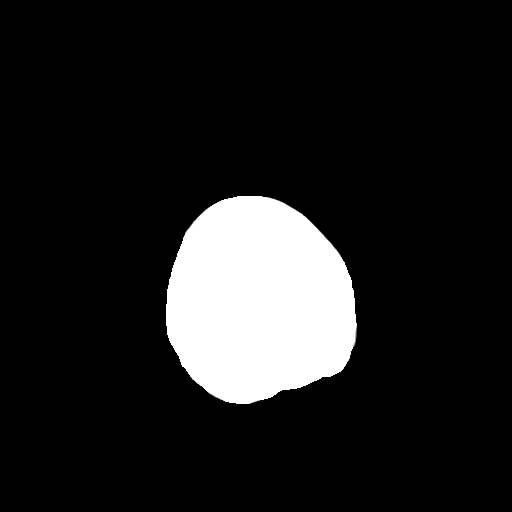
[im 29/32  bone]
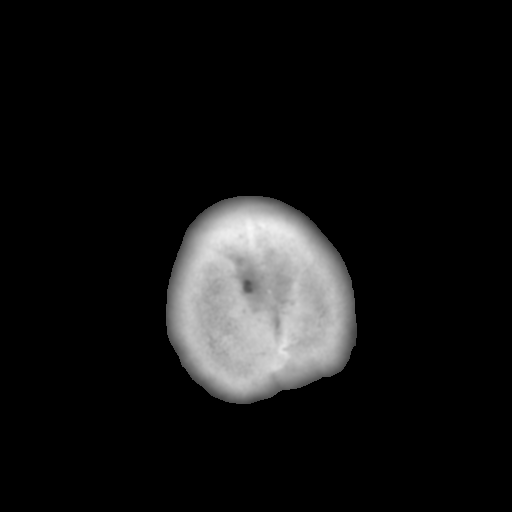

[Series 4: coronal soft · coronal · 0.33mm/px · 3 of 79 slices shown]
[im 27/79  brain]
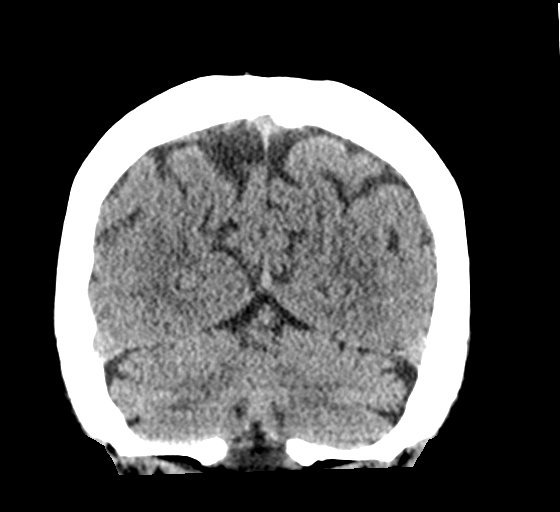
[im 35/79  brain]
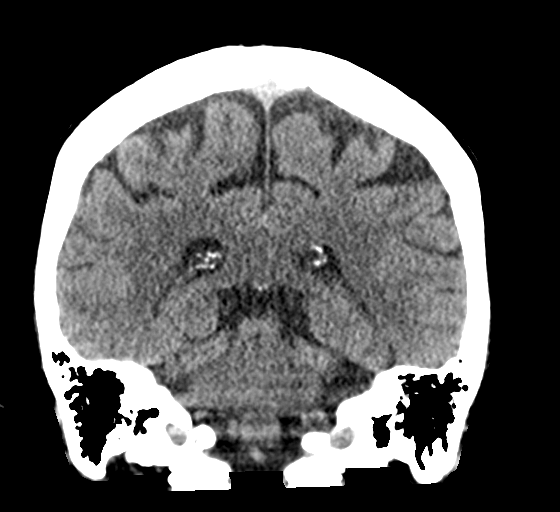
[im 44/79  brain]
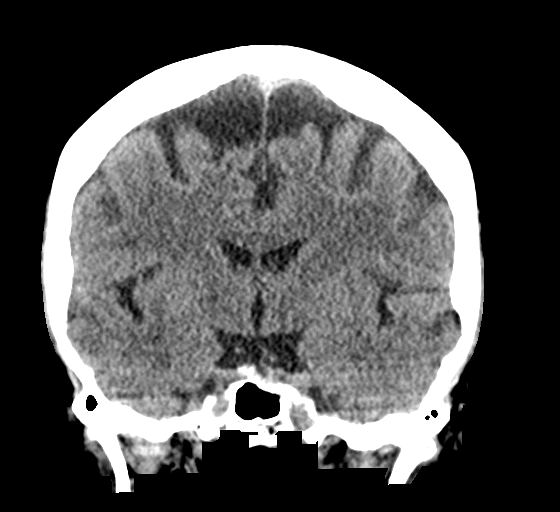

[Series 5: sagittal soft · sagittal · 0.36mm/px · 3 of 60 slices shown]
[im 20/60  brain]
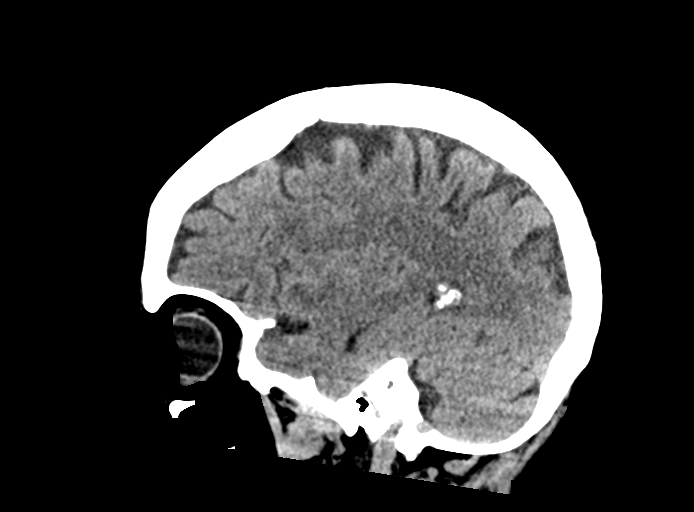
[im 30/60  brain]
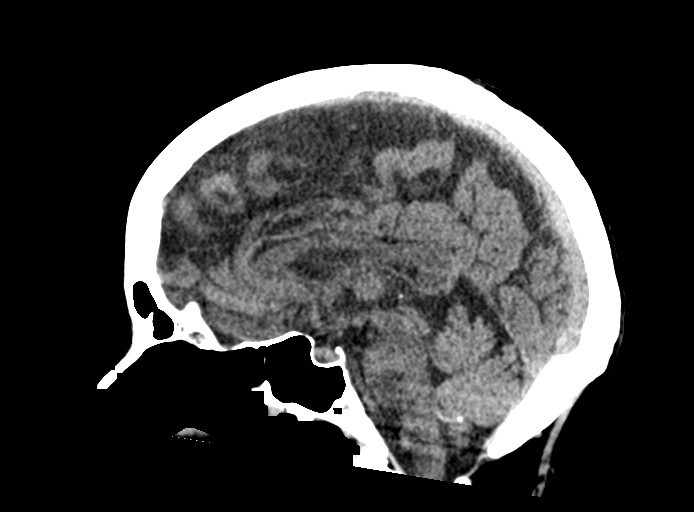
[im 40/60  brain]
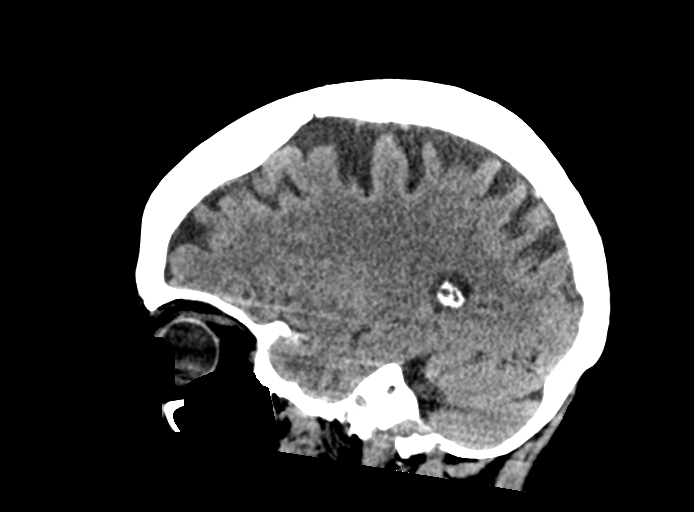

[15 of 47 positions shown; findings below may reference images not displayed]

FINDINGS: Brain: Age related cerebral atrophy, ventriculomegaly and
periventricular white matter disease. No extra-axial fluid
collections are identified. No CT findings for acute hemispheric
infarction or intracranial hemorrhage. No mass lesions. The
brainstem and cerebellum are normal.

Vascular: Moderate vascular calcifications but no aneurysm or
hyperdense vessels.

Skull: No acute skull fracture is identified. There is an air-fluid
level noted in left half of the sphenoid sinus. Few small dots of
air are also noted near the carotid canals bilaterally. Findings
could be due to a skull base fracture.

There is buckling of the left lateral orbital wall consistent with
anacute fracture.

Sinuses/Orbits: Air-fluid level noted in the left half of the
sphenoid sinus. The other paranasal sinuses are clear. The mastoid
air cells and middle ear cavities are clear. The globes are intact.

Other: There is a supraorbital hematoma on the left side.
IMPRESSION: 1. Age related cerebral atrophy, ventriculomegaly and
periventricular white matter disease.
2. No acute intracranial findings.
3. Left supraorbital hematoma.
4. Fluid in the left half of the sphenoid sinus suspicious for skull
base fracture. There are also some small dots of air noted around
the carotid canals and in the left orbit.
5. Fracture of the left lateral orbital wall.

## 2023-07-07 ENCOUNTER — Other Ambulatory Visit (HOSPITAL_COMMUNITY): Payer: Self-pay | Admitting: Internal Medicine

## 2023-07-07 ENCOUNTER — Ambulatory Visit (HOSPITAL_COMMUNITY)
Admission: RE | Admit: 2023-07-07 | Discharge: 2023-07-07 | Disposition: A | Discharge status: Home / Self Care | Payer: Medicare PPO | Source: Ambulatory Visit | Attending: Internal Medicine | Admitting: Internal Medicine

## 2023-07-07 DIAGNOSIS — R059 Cough, unspecified: Secondary | ICD-10-CM

## 2023-07-15 DIAGNOSIS — Z23 Encounter for immunization: Secondary | ICD-10-CM | POA: Diagnosis not present

## 2023-09-20 DIAGNOSIS — H04123 Dry eye syndrome of bilateral lacrimal glands: Secondary | ICD-10-CM | POA: Diagnosis not present

## 2023-09-20 DIAGNOSIS — H43813 Vitreous degeneration, bilateral: Secondary | ICD-10-CM | POA: Diagnosis not present

## 2023-09-20 DIAGNOSIS — H26493 Other secondary cataract, bilateral: Secondary | ICD-10-CM | POA: Diagnosis not present

## 2023-09-20 DIAGNOSIS — H401133 Primary open-angle glaucoma, bilateral, severe stage: Secondary | ICD-10-CM | POA: Diagnosis not present

## 2023-11-10 DIAGNOSIS — R0789 Other chest pain: Secondary | ICD-10-CM | POA: Diagnosis not present

## 2023-12-07 DIAGNOSIS — H26491 Other secondary cataract, right eye: Secondary | ICD-10-CM | POA: Diagnosis not present

## 2023-12-14 DIAGNOSIS — M79672 Pain in left foot: Secondary | ICD-10-CM | POA: Diagnosis not present

## 2023-12-14 DIAGNOSIS — I7389 Other specified peripheral vascular diseases: Secondary | ICD-10-CM | POA: Diagnosis not present

## 2023-12-14 DIAGNOSIS — M79675 Pain in left toe(s): Secondary | ICD-10-CM | POA: Diagnosis not present

## 2023-12-14 DIAGNOSIS — M79674 Pain in right toe(s): Secondary | ICD-10-CM | POA: Diagnosis not present

## 2023-12-14 DIAGNOSIS — L84 Corns and callosities: Secondary | ICD-10-CM | POA: Diagnosis not present

## 2023-12-14 DIAGNOSIS — B351 Tinea unguium: Secondary | ICD-10-CM | POA: Diagnosis not present

## 2023-12-14 DIAGNOSIS — M79671 Pain in right foot: Secondary | ICD-10-CM | POA: Diagnosis not present

## 2023-12-14 DIAGNOSIS — G609 Hereditary and idiopathic neuropathy, unspecified: Secondary | ICD-10-CM | POA: Diagnosis not present

## 2023-12-29 DIAGNOSIS — R059 Cough, unspecified: Secondary | ICD-10-CM | POA: Diagnosis not present

## 2023-12-29 DIAGNOSIS — R052 Subacute cough: Secondary | ICD-10-CM | POA: Diagnosis not present

## 2023-12-30 DIAGNOSIS — J8410 Pulmonary fibrosis, unspecified: Secondary | ICD-10-CM | POA: Diagnosis not present

## 2023-12-30 DIAGNOSIS — R918 Other nonspecific abnormal finding of lung field: Secondary | ICD-10-CM | POA: Diagnosis not present

## 2023-12-30 DIAGNOSIS — J479 Bronchiectasis, uncomplicated: Secondary | ICD-10-CM | POA: Diagnosis not present

## 2024-01-03 DIAGNOSIS — J479 Bronchiectasis, uncomplicated: Secondary | ICD-10-CM | POA: Diagnosis not present

## 2024-01-03 DIAGNOSIS — R059 Cough, unspecified: Secondary | ICD-10-CM | POA: Diagnosis not present

## 2024-01-03 DIAGNOSIS — J189 Pneumonia, unspecified organism: Secondary | ICD-10-CM | POA: Diagnosis not present

## 2024-01-06 DIAGNOSIS — J189 Pneumonia, unspecified organism: Secondary | ICD-10-CM | POA: Diagnosis not present

## 2024-01-26 DIAGNOSIS — J479 Bronchiectasis, uncomplicated: Secondary | ICD-10-CM | POA: Diagnosis not present

## 2024-03-02 DIAGNOSIS — R918 Other nonspecific abnormal finding of lung field: Secondary | ICD-10-CM | POA: Diagnosis not present

## 2024-03-02 DIAGNOSIS — J479 Bronchiectasis, uncomplicated: Secondary | ICD-10-CM | POA: Diagnosis not present

## 2024-03-02 DIAGNOSIS — C220 Liver cell carcinoma: Secondary | ICD-10-CM | POA: Diagnosis not present

## 2024-03-09 DIAGNOSIS — J479 Bronchiectasis, uncomplicated: Secondary | ICD-10-CM | POA: Diagnosis not present

## 2024-03-10 DIAGNOSIS — J479 Bronchiectasis, uncomplicated: Secondary | ICD-10-CM | POA: Diagnosis not present

## 2024-03-11 DIAGNOSIS — J479 Bronchiectasis, uncomplicated: Secondary | ICD-10-CM | POA: Diagnosis not present

## 2024-03-12 DIAGNOSIS — J479 Bronchiectasis, uncomplicated: Secondary | ICD-10-CM | POA: Diagnosis not present

## 2024-03-29 DIAGNOSIS — Z1231 Encounter for screening mammogram for malignant neoplasm of breast: Secondary | ICD-10-CM | POA: Diagnosis not present

## 2024-03-30 DIAGNOSIS — Z1231 Encounter for screening mammogram for malignant neoplasm of breast: Secondary | ICD-10-CM | POA: Diagnosis not present

## 2024-04-09 DIAGNOSIS — J479 Bronchiectasis, uncomplicated: Secondary | ICD-10-CM | POA: Diagnosis not present

## 2024-04-13 DIAGNOSIS — H353131 Nonexudative age-related macular degeneration, bilateral, early dry stage: Secondary | ICD-10-CM | POA: Diagnosis not present

## 2024-04-13 DIAGNOSIS — H43813 Vitreous degeneration, bilateral: Secondary | ICD-10-CM | POA: Diagnosis not present

## 2024-04-13 DIAGNOSIS — H401133 Primary open-angle glaucoma, bilateral, severe stage: Secondary | ICD-10-CM | POA: Diagnosis not present

## 2024-04-13 DIAGNOSIS — H04123 Dry eye syndrome of bilateral lacrimal glands: Secondary | ICD-10-CM | POA: Diagnosis not present

## 2024-04-23 DIAGNOSIS — Z1283 Encounter for screening for malignant neoplasm of skin: Secondary | ICD-10-CM | POA: Diagnosis not present

## 2024-04-23 DIAGNOSIS — L820 Inflamed seborrheic keratosis: Secondary | ICD-10-CM | POA: Diagnosis not present

## 2024-04-23 DIAGNOSIS — D225 Melanocytic nevi of trunk: Secondary | ICD-10-CM | POA: Diagnosis not present

## 2024-07-27 DIAGNOSIS — Z0000 Encounter for general adult medical examination without abnormal findings: Secondary | ICD-10-CM | POA: Diagnosis not present

## 2024-07-27 DIAGNOSIS — Z23 Encounter for immunization: Secondary | ICD-10-CM | POA: Diagnosis not present

## 2024-08-09 DIAGNOSIS — L539 Erythematous condition, unspecified: Secondary | ICD-10-CM | POA: Diagnosis not present

## 2024-08-09 DIAGNOSIS — R0602 Shortness of breath: Secondary | ICD-10-CM | POA: Diagnosis not present

## 2024-08-09 DIAGNOSIS — L03032 Cellulitis of left toe: Secondary | ICD-10-CM | POA: Diagnosis not present

## 2024-08-09 DIAGNOSIS — R609 Edema, unspecified: Secondary | ICD-10-CM | POA: Diagnosis not present

## 2024-08-09 DIAGNOSIS — J479 Bronchiectasis, uncomplicated: Secondary | ICD-10-CM | POA: Diagnosis not present

## 2024-08-09 DIAGNOSIS — R059 Cough, unspecified: Secondary | ICD-10-CM | POA: Diagnosis not present

## 2024-08-09 DIAGNOSIS — M79675 Pain in left toe(s): Secondary | ICD-10-CM | POA: Diagnosis not present

## 2024-09-03 DIAGNOSIS — H401133 Primary open-angle glaucoma, bilateral, severe stage: Secondary | ICD-10-CM | POA: Diagnosis not present
